# Patient Record
Sex: Male | Born: 1985 | Hispanic: No | Marital: Single | Smoking: Former smoker
Health system: Southern US, Community
[De-identification: ages and names within clinical notes are randomized; demographics above are authoritative.]

## PROBLEM LIST (undated history)

## (undated) HISTORY — PX: APPENDECTOMY: SHX54

---

## 2017-02-07 ENCOUNTER — Emergency Department (HOSPITAL_BASED_OUTPATIENT_CLINIC_OR_DEPARTMENT_OTHER)
Admission: EM | Admit: 2017-02-07 | Discharge: 2017-02-07 | Disposition: A | Discharge status: Home / Self Care | Payer: Worker's Compensation | Attending: Emergency Medicine | Admitting: Emergency Medicine

## 2017-02-07 ENCOUNTER — Encounter (HOSPITAL_BASED_OUTPATIENT_CLINIC_OR_DEPARTMENT_OTHER): Payer: Self-pay | Admitting: Emergency Medicine

## 2017-02-07 ENCOUNTER — Emergency Department (HOSPITAL_BASED_OUTPATIENT_CLINIC_OR_DEPARTMENT_OTHER): Payer: Worker's Compensation

## 2017-02-07 DIAGNOSIS — W312XXA Contact with powered woodworking and forming machines, initial encounter: Secondary | ICD-10-CM | POA: Diagnosis not present

## 2017-02-07 DIAGNOSIS — Y929 Unspecified place or not applicable: Secondary | ICD-10-CM | POA: Diagnosis not present

## 2017-02-07 DIAGNOSIS — S61211A Laceration without foreign body of left index finger without damage to nail, initial encounter: Secondary | ICD-10-CM

## 2017-02-07 DIAGNOSIS — Y9389 Activity, other specified: Secondary | ICD-10-CM | POA: Insufficient documentation

## 2017-02-07 DIAGNOSIS — Z87891 Personal history of nicotine dependence: Secondary | ICD-10-CM | POA: Insufficient documentation

## 2017-02-07 DIAGNOSIS — Y990 Civilian activity done for income or pay: Secondary | ICD-10-CM | POA: Diagnosis not present

## 2017-02-07 MED ORDER — HYDROCODONE-ACETAMINOPHEN 5-325 MG PO TABS
1.0000 | ORAL_TABLET | Freq: Once | ORAL | Status: AC
  Administered 2017-02-07: 2 via ORAL
  Filled 2017-02-07: qty 2

## 2017-02-07 MED ORDER — LIDOCAINE HCL (PF) 1 % IJ SOLN
5.0000 mL | Freq: Once | INTRAMUSCULAR | Status: AC
  Administered 2017-02-07: 5 mL

## 2017-02-07 MED ORDER — LIDOCAINE HCL 2 % IJ SOLN: 10.0000 mL | Freq: Once | INTRAMUSCULAR | Status: DC

## 2017-02-07 MED ORDER — LIDOCAINE HCL (PF) 1 % IJ SOLN
INTRAMUSCULAR | Status: AC
  Administered 2017-02-07: 5 mL
  Filled 2017-02-07: qty 10

## 2017-02-07 NOTE — Discharge Instructions (Signed)
Please have your stitches removed in 7 days. Return to the emergency department for new or worsening symptoms including redness swelling or warmth to the finger. You can clean the wound with warm soap and water. Keep the finger covered with a bandage.

## 2017-02-07 NOTE — ED Triage Notes (Signed)
Pt c/o laceration to LT index finger from a sanding machine.

## 2017-02-07 NOTE — ED Notes (Signed)
Patient transported to X-ray 

## 2017-02-07 NOTE — ED Provider Notes (Signed)
[facility]-EMERGENCY DEPT Provider Note   CSN: [id number] Arrival date & time: 02/07/17  0856     History   Chief Complaint Chief Complaint  Patient presents with  . Laceration    HPI Alex Marshall is a 31 y.o. male who presents to the Emergency Department with a laceration to the left index finger that occurred at approximately 7:30 AM. He reports he was at work using a sanding machine on a wooden table when it slipped and his left index finger got caught under the machine. He rates the current, constant pain as 7/10. No injuries to other fingers, no pain to the remainder of the hand, no left wrist pain. He reports his tetanus was updated in the last 2-3 years. No history of previous injuries to the left index finger or to the left hand. He treated the injury by wrapping it to stop the bleeding. No other treatment prior to arrival.  The history is provided by the patient. No language interpreter was used.    History reviewed. No pertinent past medical history.  There are no active problems to display for this patient.   Past Surgical History:  Procedure Laterality Date  . APPENDECTOMY         Home Medications    Prior to Admission medications   Not on File    Family History History reviewed. No pertinent family history.  Social History Social History  Substance Use Topics  . Smoking status: Former Games developer  . Smokeless tobacco: Never Used  . Alcohol use Yes     Comment: occ     Allergies   Patient has no known allergies.   Review of Systems Review of Systems  Constitutional: Negative for chills and fever.  Respiratory: Negative for shortness of breath.   Musculoskeletal: Negative for myalgias.  Skin: Positive for wound. Negative for pallor.  Allergic/Immunologic: Negative for immunocompromised state.  Neurological: Negative for light-headedness.     Physical Exam Updated Vital Signs BP 133/79 (BP Location: Right Arm)   Pulse 63   Temp 97.7 F (36.5  C) (Oral)   Resp 16   Ht  (1.753 m)   Wt 85.3 kg   SpO2 100%   BMI 27.76 kg/m   Physical Exam  Constitutional: He appears well-developed and well-nourished. No distress.  HENT:  Head: Atraumatic.  Eyes: Conjunctivae are normal.  Neck: Neck supple.  Cardiovascular: Normal rate.   Pulmonary/Chest: Effort normal.  Abdominal: Soft.  Neurological: He is alert.  Skin: Capillary refill takes less than 2 seconds. He is not diaphoretic.  There is a 1.5 cm straight laceration to the index finger of the left hand that extends from the ulnar aspect of the PIP joint to the radial aspect of the DIP joint. The bandage was wrapped upon initial evaluation. The wound is hemostatic with removal of the bandage. No obvious foreign bodies. No obvious deformities. Good strength to the digit. 5/5 strength to the digits 1 and 3-5 of the left hand. Sensation is intact.   Psychiatric: His behavior is normal.  Nursing note and vitals reviewed.    ED Treatments / Results  Labs (all labs ordered are listed, but only abnormal results are displayed) Labs Reviewed - No data to display  EKG  EKG Interpretation None       Radiology No results found.  Procedures .Marland KitchenLaceration Repair Date/Time: 02/07/2017 10:02 AM Performed by: Lilian Kapur, Denorris Reust A Authorized by: Frederik Pear A   Consent:    Consent obtained:  Verbal  Consent given by:  Patient   Risks discussed:  Infection, pain and tendon damage   Alternatives discussed:  No treatment Anesthesia (see MAR for exact dosages):    Anesthesia method:  Local infiltration   Local anesthetic:  Lidocaine 1% w/o epi Laceration details:    Location: left index finger.   Length (cm):  1.5 Repair type:    Repair type:  Simple Pre-procedure details:    Preparation:  Imaging obtained to evaluate for foreign bodies and patient was prepped and draped in usual sterile fashion Exploration:    Hemostasis achieved with:  Direct pressure   Wound exploration:  wound explored through full range of motion and entire depth of wound probed and visualized     Wound extent: no fascia violation noted, no foreign bodies/material noted, no muscle damage noted, no nerve damage noted, no tendon damage noted and no underlying fracture noted     Contaminated: no   Treatment:    Area cleansed with:  Betadine   Amount of cleaning:  Standard   Irrigation solution:  Sterile saline   Visualized foreign bodies/material removed: no   Skin repair:    Repair method:  Sutures   Suture size:  4-0   Suture material:  Prolene   Suture technique:  Simple interrupted   Number of sutures:  5 Approximation:    Approximation:  Close Post-procedure details:    Dressing:  Antibiotic ointment and sterile dressing   Patient tolerance of procedure:  Tolerated well, no immediate complications    (including critical care time)  Medications Ordered in ED Medications  HYDROcodone-acetaminophen (NORCO/VICODIN) 5-325 MG per tablet 1-2 tablet (2 tablets Oral Given 02/07/17 1113)  lidocaine (PF) (XYLOCAINE) 1 % injection 5 mL (5 mLs Infiltration Given by Other 02/07/17 1116)     Initial Impression / Assessment and Plan / ED Course  I have reviewed the triage vital signs and the nursing notes.  Pertinent labs & imaging results that were available during my care of the patient were reviewed by me and considered in my medical decision making (see chart for details).     Tdap booster UTD. Pressure irrigation performed. Laceration occurred < 8 hours prior to repair which was well tolerated. Pt has no co morbidities to effect normal wound healing. Discussed suture home care w pt and answered questions. Pt to f-u for wound check and suture removal in 7 days. Pt is hemodynamically stable w no complaints prior to dc.     Final Clinical Impressions(s) / ED Diagnoses   Final diagnoses:  Laceration of blood vessel of left index finger, initial encounter    New Prescriptions There  are no discharge medications for this patient.    Barkley Boards, PA-C 02/10/17 0018    Tilden Fossa, MD 02/14/17 [phone]

## 2017-09-13 IMAGING — DX DG HAND COMPLETE 3+V*L*
3 series · 3 of 3 positions shown · non-contrast
Comparison: No recent prior .

CLINICAL DATA: Pain.  Laceration.  Work injury.

EXAM:
LEFT HAND - COMPLETE 3+ VIEW

[hand pa]
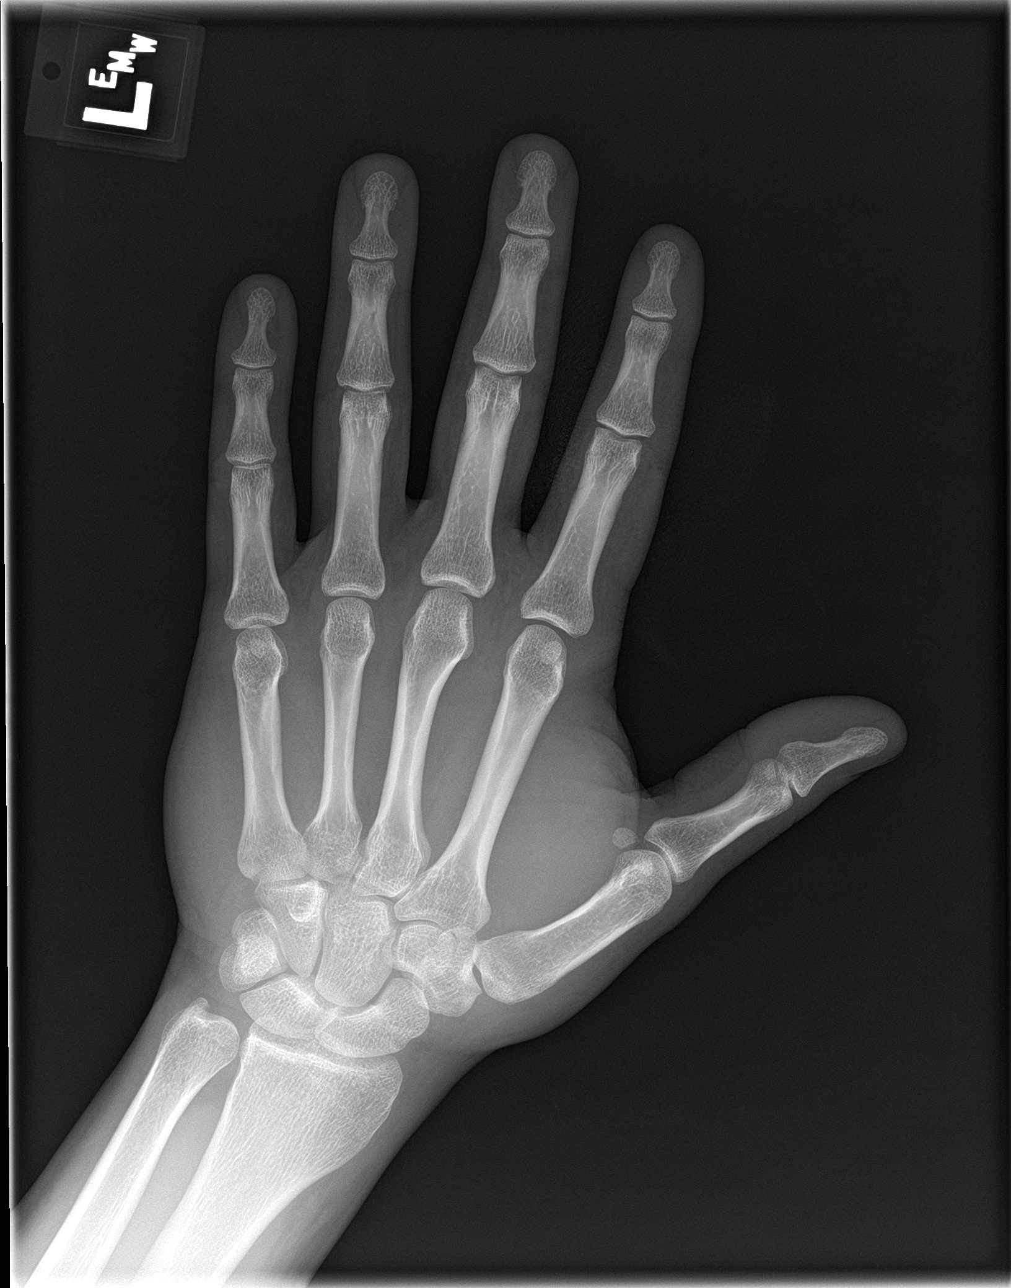

[hand obl]
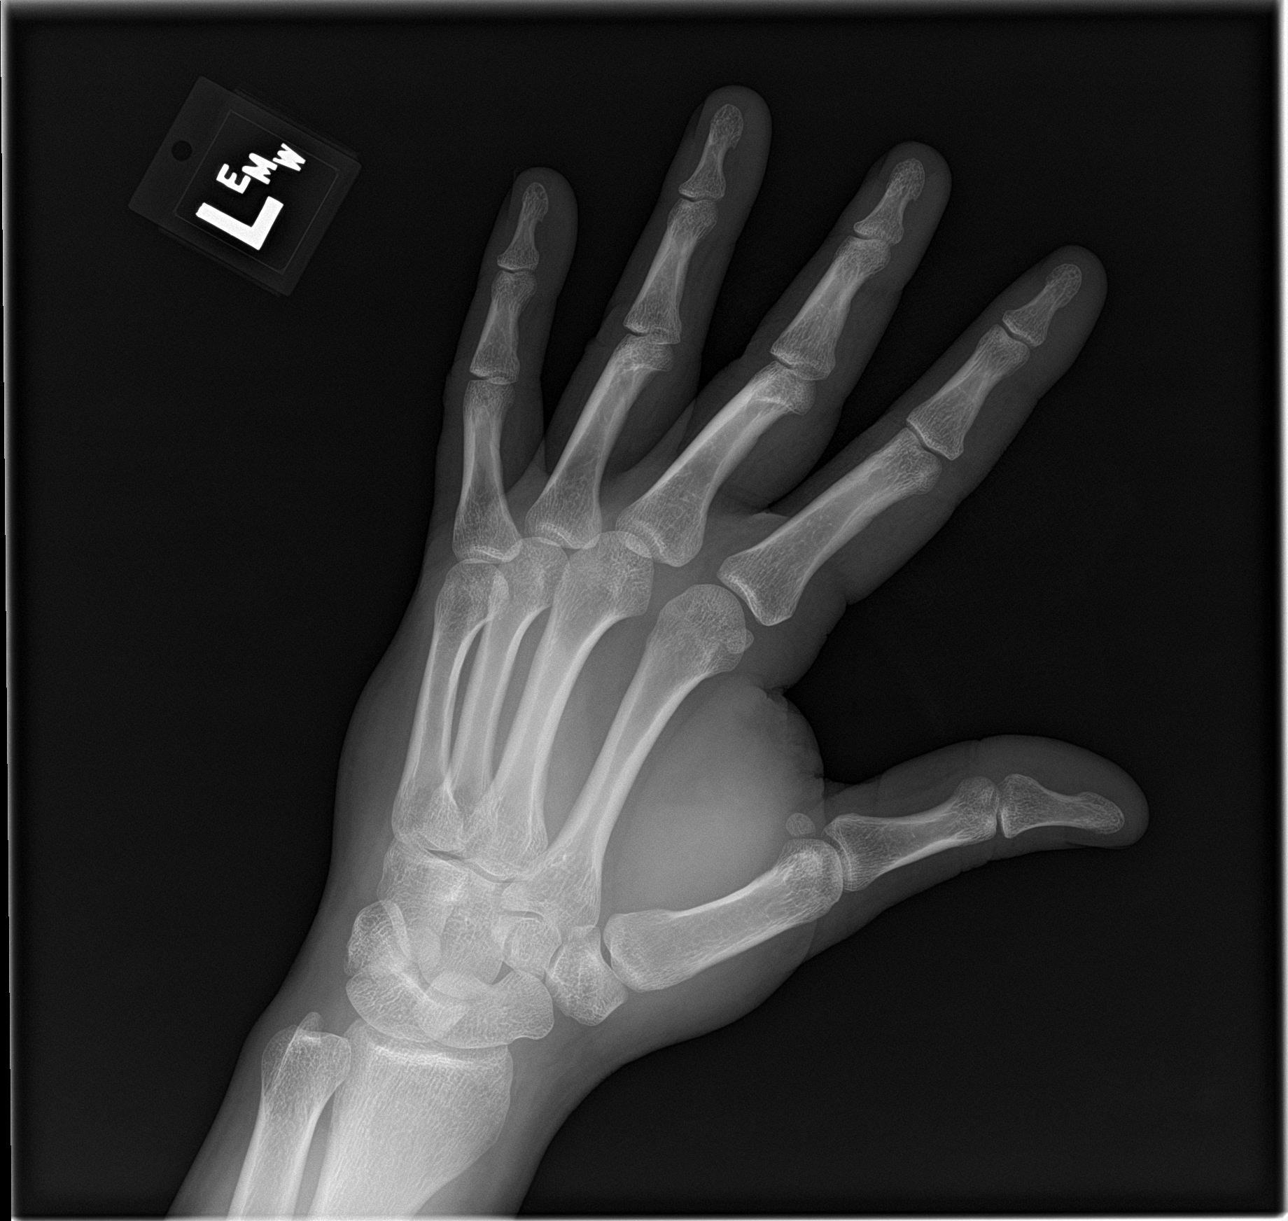

[hand lat]
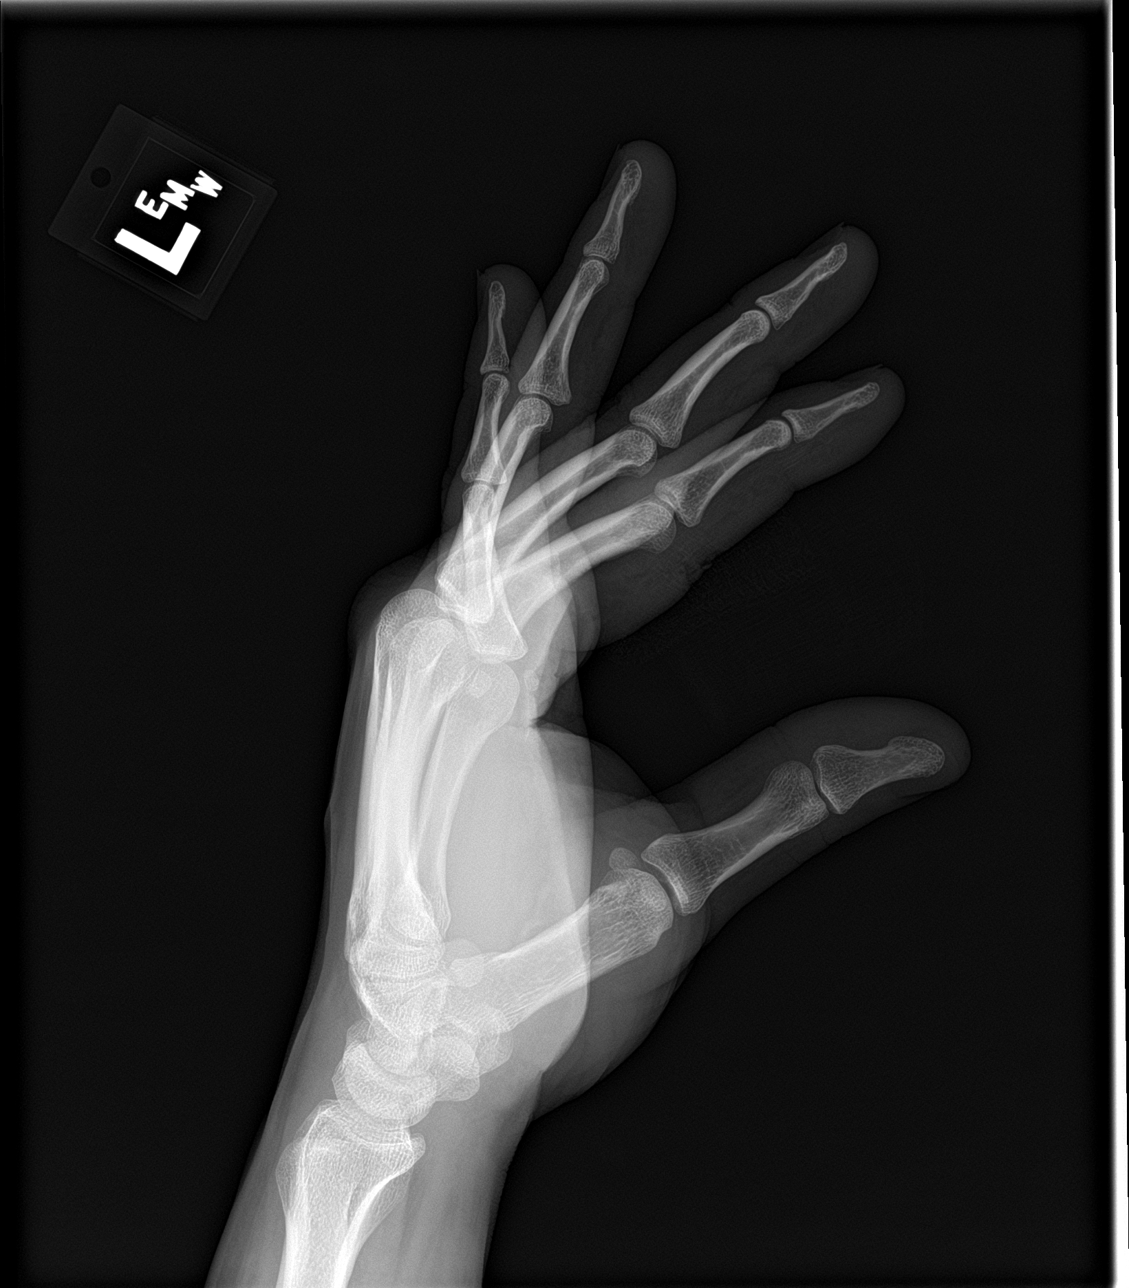

[3 of 3 positions shown; findings below may reference images not displayed]

FINDINGS: No radiopaque foreign body. No acute bony abnormality identified. No
evidence of fracture or dislocation.
IMPRESSION: No acute or focal abnormality.
